# Patient Record
Sex: Female | Born: 2005 | Hispanic: No | Marital: Single
Health system: Southern US, Community
[De-identification: ages and names within clinical notes are randomized; demographics above are authoritative.]

---
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
## 2023-02-12 ENCOUNTER — Encounter: Payer: Self-pay | Admitting: *Deleted

## 2023-02-12 ENCOUNTER — Telehealth: Payer: Self-pay | Admitting: *Deleted

## 2023-02-12 NOTE — Telephone Encounter (Signed)
Left patient a message to call and schedule appointment from PCP referral.
